# Patient Record
Sex: Female | Born: 1965
Health system: Southern US, Community
[De-identification: ages and names within clinical notes are randomized; demographics above are authoritative.]

## PROBLEM LIST (undated history)

## (undated) DIAGNOSIS — F419 Anxiety disorder, unspecified: Secondary | ICD-10-CM

## (undated) DIAGNOSIS — E119 Type 2 diabetes mellitus without complications: Secondary | ICD-10-CM

## (undated) DIAGNOSIS — T148XXA Other injury of unspecified body region, initial encounter: Secondary | ICD-10-CM

## (undated) DIAGNOSIS — M1990 Unspecified osteoarthritis, unspecified site: Secondary | ICD-10-CM

## (undated) DIAGNOSIS — K219 Gastro-esophageal reflux disease without esophagitis: Secondary | ICD-10-CM

## (undated) DIAGNOSIS — E7800 Pure hypercholesterolemia, unspecified: Secondary | ICD-10-CM

## (undated) DIAGNOSIS — I10 Essential (primary) hypertension: Secondary | ICD-10-CM

## (undated) DIAGNOSIS — T7840XA Allergy, unspecified, initial encounter: Secondary | ICD-10-CM

## (undated) HISTORY — DX: Anxiety disorder, unspecified: F41.9

## (undated) HISTORY — PX: JOINT REPLACEMENT: SHX530

## (undated) HISTORY — DX: Other injury of unspecified body region, initial encounter: T14.8XXA

## (undated) HISTORY — PX: BACK SURGERY: SHX140

## (undated) HISTORY — PX: CARPAL TUNNEL RELEASE: SHX101

---

## 2002-02-01 HISTORY — PX: TUBAL LIGATION: SHX77

## 2005-06-03 HISTORY — PX: ROTATOR CUFF REPAIR: SHX139

## 2011-06-04 HISTORY — PX: OTHER SURGICAL HISTORY: SHX169

## 2013-04-03 HISTORY — PX: KNEE SURGERY: SHX244

## 2019-05-04 HISTORY — PX: ROTATOR CUFF REPAIR: SHX139

## 2020-01-03 LAB — TSH: TSH: 1.24 (ref <=5.90)

## 2020-03-05 ENCOUNTER — Other Ambulatory Visit: Payer: Self-pay

## 2020-03-05 ENCOUNTER — Encounter (HOSPITAL_COMMUNITY): Payer: Self-pay | Admitting: Emergency Medicine

## 2020-03-05 ENCOUNTER — Emergency Department (HOSPITAL_COMMUNITY)
Admission: EM | Admit: 2020-03-05 | Discharge: 2020-03-05 | Disposition: A | Discharge status: Home / Self Care | Payer: Medicare HMO | Attending: Emergency Medicine | Admitting: Emergency Medicine

## 2020-03-05 ENCOUNTER — Emergency Department (HOSPITAL_COMMUNITY): Payer: Medicare HMO

## 2020-03-05 DIAGNOSIS — R0789 Other chest pain: Secondary | ICD-10-CM | POA: Diagnosis not present

## 2020-03-05 DIAGNOSIS — Z79899 Other long term (current) drug therapy: Secondary | ICD-10-CM | POA: Insufficient documentation

## 2020-03-05 DIAGNOSIS — R079 Chest pain, unspecified: Secondary | ICD-10-CM

## 2020-03-05 DIAGNOSIS — Z7982 Long term (current) use of aspirin: Secondary | ICD-10-CM | POA: Diagnosis not present

## 2020-03-05 HISTORY — DX: Essential (primary) hypertension: I10

## 2020-03-05 HISTORY — DX: Pure hypercholesterolemia, unspecified: E78.00

## 2020-03-05 HISTORY — DX: Unspecified osteoarthritis, unspecified site: M19.90

## 2020-03-05 HISTORY — DX: Allergy, unspecified, initial encounter: T78.40XA

## 2020-03-05 HISTORY — DX: Type 2 diabetes mellitus without complications: E11.9

## 2020-03-05 HISTORY — DX: Gastro-esophageal reflux disease without esophagitis: K21.9

## 2020-03-05 LAB — BASIC METABOLIC PANEL
Anion gap: 11 (ref 5–15)
BUN: 11 mg/dL (ref 6–20)
CO2: 23 mmol/L (ref 22–32)
Calcium: 9.4 mg/dL (ref 8.9–10.3)
Chloride: 105 mmol/L (ref 98–111)
Creatinine, Ser: 0.62 mg/dL (ref 0.44–1.00)
GFR calc Af Amer: >60 mL/min (ref >60)
GFR calc non Af Amer: >60 mL/min (ref >60)
Glucose, Bld: 140 mg/dL — ABNORMAL HIGH (ref 70–99)
Potassium: 4.1 mmol/L (ref 3.5–5.1)
Sodium: 139 mmol/L (ref 135–145)

## 2020-03-05 LAB — CBC
HCT: 36 % (ref 36.0–46.0)
Hemoglobin: 11.8 g/dL — ABNORMAL LOW (ref 12.0–15.0)
MCH: 29.4 pg (ref 26.0–34.0)
MCHC: 32.8 g/dL (ref 30.0–36.0)
MCV: 89.6 fL (ref 80.0–100.0)
Platelets: 336 10*3/uL (ref 150–400)
RBC: 4.02 MIL/uL (ref 3.87–5.11)
RDW: 13.6 % (ref 11.5–15.5)
WBC: 5.3 10*3/uL (ref 4.0–10.5)
nRBC: 0 % (ref 0.0–0.2)

## 2020-03-05 LAB — I-STAT BETA HCG BLOOD, ED (MC, WL, AP ONLY): I-stat hCG, quantitative: <5 m[IU]/mL (ref <5)

## 2020-03-05 LAB — TROPONIN I (HIGH SENSITIVITY)
Troponin I (High Sensitivity): 4 ng/L (ref <18)
Troponin I (High Sensitivity): 3 ng/L (ref <18)

## 2020-03-05 MED ORDER — SODIUM CHLORIDE 0.9% FLUSH: 3.0000 mL | Freq: Once | INTRAVENOUS | Status: DC

## 2020-03-05 NOTE — ED Provider Notes (Signed)
[facility] EMERGENCY DEPARTMENT Provider Note   CSN: [id number] Arrival date & time: 03/05/20  0750     History No chief complaint on file.   Allure Ia Leeb is a 54 y.o. female.  HPI  HPI: A 54 year old patient with a history of treated diabetes, hypertension, hypercholesterolemia and obesity presents for evaluation of chest pain. Initial onset of pain was more than 6 hours ago. The patient's chest pain is described as heaviness/pressure/tightness and is not worse with exertion. The patient complains of nausea. The patient's chest pain is middle- or left-sided, is not well-localized, is not sharp and does radiate to the arms/jaw/neck. The patient denies diaphoresis. The patient has a family history of coronary artery disease in a first-degree relative with onset less than age 81. The patient has no history of stroke, has no history of peripheral artery disease and has not smoked in the past 90 days.  Pain was moderate.  It has resolved prior to my evaluation without intervention.  Pain began after waking up in the early morning hours but did not wake from sleep.  Patient states followed at [facility] [address]  History reviewed. No pertinent past medical history.  There are no problems to display for this patient.   History reviewed. No pertinent surgical history.   OB History   No obstetric history on file.     History reviewed. No pertinent family history.  Social History   Tobacco Use  . Smoking status: Never Smoker  . Smokeless tobacco: Never Used  Substance Use Topics  . Alcohol use: Not on file  . Drug use: Not on file    Home Medications Prior to Admission medications   Medication Sig Start Date End Date Taking? Authorizing Provider  amLODipine (NORVASC) 5 MG tablet Take 5 mg by mouth daily. 01/12/20  Yes [provider]  ascorbic acid (VITAMIN C) 500 MG tablet Take 500 mg by mouth daily.   Yes [provider]    Aspirin Buf,CaCarb-MgCarb-MgO, 81 MG TABS Take 81 mg by mouth daily.   Yes [provider]  Carboxymethylcellulose Sodium (REFRESH LIQUIGEL OP) Apply 1 application to eye daily.   Yes [provider]  Cholecalciferol 125 MCG (5000 UT) TABS Take 125 mcg by mouth daily.   Yes [provider]  fluticasone (FLONASE) 50 MCG/ACT nasal spray Place 1 spray into both nostrils daily. 01/12/20  Yes [provider]  furosemide (LASIX) 20 MG tablet Take 20 mg by mouth daily. 02/06/20  Yes [provider]  gabapentin (NEURONTIN) 300 MG capsule Take 300 mg by mouth in the morning, at noon, and at bedtime. 09/21/19  Yes [provider]  glipiZIDE (GLUCOTROL XL) 5 MG 24 hr tablet Take 10 mg by mouth daily.    Yes [provider]  HYDROcodone-acetaminophen (NORCO/VICODIN) 5-325 MG tablet Take 1 tablet by mouth 3 (three) times daily as needed for pain. 02/24/20  Yes [provider]  IBU 800 MG tablet Take 800 mg by mouth every 8 (eight) hours. 01/18/20  Yes [provider]  metFORMIN (GLUCOPHAGE-XR) 500 MG 24 hr tablet Take 1,000 mg by mouth in the morning and at bedtime.  12/13/14  Yes [provider]  montelukast (SINGULAIR) 10 MG tablet Take 10 mg by mouth daily. 01/16/20  Yes [provider]  Olopatadine HCl 0.2 % SOLN Place 1 drop into both eyes daily.   Yes [provider]  omeprazole (PRILOSEC) 20 MG capsule Take 20 mg  by mouth daily. 02/15/20  Yes [provider]  potassium chloride (KLOR-CON) 10 MEQ tablet Take 10 mEq by mouth daily. 01/21/20  Yes [provider]  pravastatin (PRAVACHOL) 40 MG tablet Take 40 mg by mouth daily. 01/16/20  Yes [provider]  pregabalin (LYRICA) 25 MG capsule Take 25 mg by mouth at bedtime. 02/24/20  Yes [provider]  tiZANidine (ZANAFLEX) 4 MG tablet Take 4 mg by mouth 3 (three) times daily. 12/20/19  Yes [provider]  vitamin  B-12 (CYANOCOBALAMIN) 1000 MCG tablet Take 1,000 mcg by mouth daily.   Yes [provider]    Allergies    Morphine and related  Review of Systems   Review of Systems  All other systems reviewed and are negative.   Physical Exam Updated Vital Signs BP 114/69 (BP Location: Right Arm)   Pulse 88   Temp 97.9 F (36.6 C) (Oral)   Resp 16   Ht 1.6 m (5\' 3" )   Wt 108.9 kg   SpO2 98%   BMI 42.51 kg/m   Physical Exam Vitals and nursing note reviewed.  Constitutional:      Appearance: She is well-developed. She is obese.  HENT:     Head: Normocephalic and atraumatic.     Right Ear: External ear normal.     Left Ear: External ear normal.     Nose: Nose normal.  Eyes:     Conjunctiva/sclera: Conjunctivae normal.     Pupils: Pupils are equal, round, and reactive to light.  Cardiovascular:     Rate and Rhythm: Normal rate and regular rhythm.     Heart sounds: Normal heart sounds.  Pulmonary:     Effort: Pulmonary effort is normal.     Breath sounds: Normal breath sounds.  Abdominal:     General: Bowel sounds are normal.     Palpations: Abdomen is soft.  Musculoskeletal:        General: Normal range of motion.     Cervical back: Normal range of motion and neck supple.  Skin:    General: Skin is warm and dry.  Neurological:     Mental Status: She is alert and oriented to person, place, and time.     Deep Tendon Reflexes: Reflexes are normal and symmetric.  Psychiatric:        Behavior: Behavior normal.        Thought Content: Thought content normal.        Judgment: Judgment normal.     ED Results / Procedures / Treatments   Labs (all labs ordered are listed, but only abnormal results are displayed) Labs Reviewed  BASIC METABOLIC PANEL - Abnormal; Notable for the following components:      Result Value   Glucose, Bld 140 (*)    All other components within normal limits  CBC - Abnormal; Notable for the following components:   Hemoglobin 11.8 (*)    All  other components within normal limits  I-STAT BETA HCG BLOOD, ED ([facility], [facility], [facility] ONLY)  TROPONIN I (HIGH SENSITIVITY)  TROPONIN I (HIGH SENSITIVITY)    EKG EKG Interpretation  Date/Time:  Monday Mar 05 2020 07:55:51 EDT Ventricular Rate:  95 PR Interval:  210 QRS Duration: 140 QT Interval:  382 QTC Calculation: 480 R Axis:   43 Text Interpretation: Sinus rhythm with 1st degree A-V block Right bundle branch block Abnormal ECG Confirmed by Madalyn Rob [phone]) on 03/05/2020 8:06:26 AM   Radiology DG Chest 2 View  Result Date: 03/05/2020 CLINICAL DATA:  Acute chest pain, BILATERAL arm numbness and hand tingling, shortness of breath, nausea, history hypertension and diabetes mellitus EXAM: CHEST - 2 VIEW COMPARISON:  None FINDINGS: Normal heart size, mediastinal contours, and pulmonary vascularity. Lungs clear. No pulmonary infiltrate, pleural effusion or pneumothorax. Scattered endplate spur formation thoracic spine. IMPRESSION: No acute abnormalities. Electronically Signed   By: Ulyses Southward M.D.   On: 03/05/2020 08:31    Procedures Procedures (including critical care time)  Medications Ordered in ED Medications  sodium chloride flush (NS) 0.9 % injection 3 mL (has no administration in time range)    ED Course  I have reviewed the triage vital signs and the nursing notes.  Pertinent labs & imaging results that were available during my care of the patient were reviewed by me and considered in my medical decision making (see chart for details).  54 year old female multiple risk factors for coronary artery disease, heart score 6, presents today complaining of chest pain.  Patient describes the pain as occurring after in the early morning hours.  It is in the center of the chest.  There is some radiation to her arms.  Pain resolved prior to my evaluation without intervention.  She has got some previous episodes which include tingling all over her body.  She was seen in outlying hospital  at that time.  EKG here is normal as well as troponin and repeat troponin are normal.   Feels improved and appears stable for discharge.  We have discussed return precautions and need for follow-up and she voices understanding.   MDM Rules/Calculators/A&P HEAR Score: 6                    Differential diagnosis includes chest wall, musculoskeletal etiologies, pulmonary etiologies including infection, pneumothorax, pulmonary embolism, diseases of the great vessels, diseases of the GI tract including esophagus and gallbladder.  Given patient's history and type of symptoms, cardiac ischemia is most concerning.  Patient evaluation, history, and physical exam as well as studies obtained here in the emergency department including chest x-Christa Fasig, labs have made the initial differential diagnosis less likely.  Patient was evaluated here through heart pathway with EKG, labs, troponin and repeat troponin these remain normal.  Patient is pain-free at this time.  We have discussed that she should return to the emergency department she has any return of her symptoms and she is referred outpatient to cardiology.  Patient voices understanding of plan. Final Clinical Impression(s) / ED Diagnoses Final diagnoses:  Chest pain, unspecified type    Rx / DC Orders ED Discharge Orders    None       Margarita Grizzle, MD 03/05/20 1339

## 2020-03-05 NOTE — ED Triage Notes (Signed)
Pt here from home with c/o cheat tightness that started  around 4 am , pt also c/o bil arm tingling , family cardiac hx ,

## 2020-05-08 ENCOUNTER — Encounter (HOSPITAL_COMMUNITY): Payer: Self-pay

## 2020-05-08 ENCOUNTER — Emergency Department (HOSPITAL_COMMUNITY): Payer: Medicare HMO

## 2020-05-08 ENCOUNTER — Emergency Department (HOSPITAL_COMMUNITY)
Admission: EM | Admit: 2020-05-08 | Discharge: 2020-05-08 | Disposition: A | Discharge status: Home / Self Care | Payer: Medicare HMO | Attending: Emergency Medicine | Admitting: Emergency Medicine

## 2020-05-08 DIAGNOSIS — M542 Cervicalgia: Secondary | ICD-10-CM | POA: Diagnosis not present

## 2020-05-08 DIAGNOSIS — E119 Type 2 diabetes mellitus without complications: Secondary | ICD-10-CM | POA: Insufficient documentation

## 2020-05-08 DIAGNOSIS — Z96659 Presence of unspecified artificial knee joint: Secondary | ICD-10-CM | POA: Insufficient documentation

## 2020-05-08 DIAGNOSIS — Z7982 Long term (current) use of aspirin: Secondary | ICD-10-CM | POA: Insufficient documentation

## 2020-05-08 DIAGNOSIS — M545 Low back pain, unspecified: Secondary | ICD-10-CM

## 2020-05-08 DIAGNOSIS — Z7984 Long term (current) use of oral hypoglycemic drugs: Secondary | ICD-10-CM | POA: Diagnosis not present

## 2020-05-08 DIAGNOSIS — Z79899 Other long term (current) drug therapy: Secondary | ICD-10-CM | POA: Diagnosis not present

## 2020-05-08 DIAGNOSIS — I10 Essential (primary) hypertension: Secondary | ICD-10-CM | POA: Diagnosis not present

## 2020-05-08 MED ORDER — LIDOCAINE 5 % EX PTCH: 2.0000 | MEDICATED_PATCH | CUTANEOUS | Status: DC

## 2020-05-08 NOTE — ED Notes (Signed)
An After Visit Summary was printed and given to the patient. Discharge instructions given and no further questions at this time.  
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
## 2020-05-18 ENCOUNTER — Other Ambulatory Visit: Payer: Self-pay | Admitting: General Practice

## 2020-05-29 ENCOUNTER — Other Ambulatory Visit: Payer: Self-pay | Admitting: General Practice

## 2020-06-12 ENCOUNTER — Encounter: Payer: Self-pay | Admitting: Endocrinology

## 2020-06-12 LAB — T4, FREE
PTH: 24
T4,Free (Direct): 1.1

## 2020-06-14 ENCOUNTER — Other Ambulatory Visit: Payer: Self-pay | Admitting: General Practice

## 2020-06-14 DIAGNOSIS — G8929 Other chronic pain: Secondary | ICD-10-CM

## 2020-06-14 DIAGNOSIS — R519 Headache, unspecified: Secondary | ICD-10-CM

## 2020-06-28 ENCOUNTER — Other Ambulatory Visit: Payer: Self-pay

## 2020-06-28 ENCOUNTER — Ambulatory Visit
Admission: RE | Admit: 2020-06-28 | Discharge: 2020-06-28 | Disposition: A | Discharge status: Home / Self Care | Payer: Medicare HMO | Source: Ambulatory Visit | Attending: General Practice | Admitting: General Practice

## 2020-06-28 DIAGNOSIS — R519 Headache, unspecified: Secondary | ICD-10-CM

## 2020-06-28 DIAGNOSIS — G8929 Other chronic pain: Secondary | ICD-10-CM

## 2020-06-28 MED ORDER — IOPAMIDOL (ISOVUE-300) INJECTION 61%
75.0000 mL | Freq: Once | INTRAVENOUS | Status: AC | PRN
  Administered 2020-06-28: 75 mL via INTRAVENOUS

## 2020-07-04 DIAGNOSIS — G894 Chronic pain syndrome: Secondary | ICD-10-CM | POA: Diagnosis not present

## 2020-07-04 DIAGNOSIS — M5030 Other cervical disc degeneration, unspecified cervical region: Secondary | ICD-10-CM | POA: Diagnosis not present

## 2020-07-04 DIAGNOSIS — M5136 Other intervertebral disc degeneration, lumbar region: Secondary | ICD-10-CM | POA: Diagnosis not present

## 2020-07-04 DIAGNOSIS — M545 Low back pain: Secondary | ICD-10-CM | POA: Diagnosis not present

## 2020-07-04 DIAGNOSIS — M47812 Spondylosis without myelopathy or radiculopathy, cervical region: Secondary | ICD-10-CM | POA: Diagnosis not present

## 2020-07-04 DIAGNOSIS — G629 Polyneuropathy, unspecified: Secondary | ICD-10-CM | POA: Diagnosis not present

## 2020-07-12 ENCOUNTER — Encounter: Payer: Self-pay | Admitting: Endocrinology

## 2020-07-12 ENCOUNTER — Other Ambulatory Visit: Payer: Self-pay

## 2020-07-12 ENCOUNTER — Ambulatory Visit (INDEPENDENT_AMBULATORY_CARE_PROVIDER_SITE_OTHER): Payer: Medicare HMO | Admitting: Endocrinology

## 2020-07-12 DIAGNOSIS — E041 Nontoxic single thyroid nodule: Secondary | ICD-10-CM | POA: Diagnosis not present
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
## 2020-07-12 NOTE — Patient Instructions (Signed)
Let's check the ultrasound.  you will receive a phone call, about a day and time for an appointment. If a biopsy is advised, I'll request it for you. If a repeat ultrasound is advised in 1 year, you could come back here, or see your doctor in [place].

## 2020-07-13 DIAGNOSIS — Z83511 Family history of glaucoma: Secondary | ICD-10-CM | POA: Diagnosis not present

## 2020-07-13 DIAGNOSIS — H52223 Regular astigmatism, bilateral: Secondary | ICD-10-CM | POA: Diagnosis not present

## 2020-07-13 DIAGNOSIS — H40003 Preglaucoma, unspecified, bilateral: Secondary | ICD-10-CM | POA: Diagnosis not present

## 2020-07-13 DIAGNOSIS — H16229 Keratoconjunctivitis sicca, not specified as Sjogren's, unspecified eye: Secondary | ICD-10-CM | POA: Diagnosis not present

## 2020-07-13 DIAGNOSIS — H40013 Open angle with borderline findings, low risk, bilateral: Secondary | ICD-10-CM | POA: Diagnosis not present

## 2020-07-13 DIAGNOSIS — H33301 Unspecified retinal break, right eye: Secondary | ICD-10-CM | POA: Diagnosis not present

## 2020-07-13 DIAGNOSIS — E119 Type 2 diabetes mellitus without complications: Secondary | ICD-10-CM | POA: Diagnosis not present

## 2020-07-13 DIAGNOSIS — H5213 Myopia, bilateral: Secondary | ICD-10-CM | POA: Diagnosis not present

## 2020-07-13 DIAGNOSIS — H1045 Other chronic allergic conjunctivitis: Secondary | ICD-10-CM | POA: Diagnosis not present

## 2020-07-19 ENCOUNTER — Ambulatory Visit
Admission: RE | Admit: 2020-07-19 | Discharge: 2020-07-19 | Disposition: A | Discharge status: Home / Self Care | Payer: Medicare HMO | Source: Ambulatory Visit | Attending: Endocrinology | Admitting: Endocrinology

## 2020-07-19 DIAGNOSIS — E041 Nontoxic single thyroid nodule: Secondary | ICD-10-CM

## 2020-07-20 ENCOUNTER — Telehealth: Payer: Self-pay

## 2020-07-20 NOTE — Telephone Encounter (Signed)
-----   Message from Romero Belling, MD sent at 07/19/2020  5:27 PM EDT ----- please contact patient: 1 year f/u is advised.  Please come back for a follow-up appointment in 1 year.

## 2020-07-20 NOTE — Telephone Encounter (Signed)
RESULTS  Results were reviewed by Dr. Ellison. A letter has been mailed to pt home address. For future reference, letter can be found in Epic. 

## 2020-07-27 DIAGNOSIS — M545 Low back pain: Secondary | ICD-10-CM | POA: Diagnosis not present

## 2020-07-27 DIAGNOSIS — M5136 Other intervertebral disc degeneration, lumbar region: Secondary | ICD-10-CM | POA: Diagnosis not present

## 2020-07-27 DIAGNOSIS — Z79899 Other long term (current) drug therapy: Secondary | ICD-10-CM | POA: Diagnosis not present

## 2020-07-27 DIAGNOSIS — G894 Chronic pain syndrome: Secondary | ICD-10-CM | POA: Diagnosis not present

## 2020-07-27 DIAGNOSIS — G629 Polyneuropathy, unspecified: Secondary | ICD-10-CM | POA: Diagnosis not present

## 2020-08-03 DIAGNOSIS — M47812 Spondylosis without myelopathy or radiculopathy, cervical region: Secondary | ICD-10-CM | POA: Diagnosis not present

## 2020-08-03 DIAGNOSIS — G894 Chronic pain syndrome: Secondary | ICD-10-CM | POA: Diagnosis not present

## 2020-08-03 DIAGNOSIS — M5030 Other cervical disc degeneration, unspecified cervical region: Secondary | ICD-10-CM | POA: Diagnosis not present

## 2020-08-03 DIAGNOSIS — M5459 Other low back pain: Secondary | ICD-10-CM | POA: Diagnosis not present

## 2020-08-03 DIAGNOSIS — M5136 Other intervertebral disc degeneration, lumbar region: Secondary | ICD-10-CM | POA: Diagnosis not present

## 2020-08-03 DIAGNOSIS — G629 Polyneuropathy, unspecified: Secondary | ICD-10-CM | POA: Diagnosis not present

## 2020-08-22 DIAGNOSIS — H16149 Punctate keratitis, unspecified eye: Secondary | ICD-10-CM | POA: Diagnosis not present

## 2020-08-22 DIAGNOSIS — H16223 Keratoconjunctivitis sicca, not specified as Sjogren's, bilateral: Secondary | ICD-10-CM | POA: Diagnosis not present

## 2020-08-22 DIAGNOSIS — H16229 Keratoconjunctivitis sicca, not specified as Sjogren's, unspecified eye: Secondary | ICD-10-CM | POA: Diagnosis not present

## 2020-08-22 DIAGNOSIS — H04129 Dry eye syndrome of unspecified lacrimal gland: Secondary | ICD-10-CM | POA: Diagnosis not present

## 2020-08-23 ENCOUNTER — Encounter: Payer: Self-pay | Admitting: Neurology

## 2020-08-23 ENCOUNTER — Ambulatory Visit (INDEPENDENT_AMBULATORY_CARE_PROVIDER_SITE_OTHER): Payer: Medicare HMO | Admitting: Neurology

## 2020-08-23 VITALS — BP 135/86 | HR 89 | Wt 247.0 lb

## 2020-08-23 DIAGNOSIS — M7918 Myalgia, other site: Secondary | ICD-10-CM | POA: Diagnosis not present

## 2020-08-23 DIAGNOSIS — M542 Cervicalgia: Secondary | ICD-10-CM | POA: Diagnosis not present

## 2020-08-23 DIAGNOSIS — G4733 Obstructive sleep apnea (adult) (pediatric): Secondary | ICD-10-CM | POA: Diagnosis not present

## 2020-08-23 NOTE — Patient Instructions (Addendum)
Physical at [facility] for cervical myofascial pain syndrome and cervicalgia to include stretching, heating, manual therapy/massage, strengthening and dry needling. [employer].  Chronic neck pain and cervicalgia.  Rrecommend follow up with pain management for possible interventions such as epidural steroid injections, occipital nerve blocks or facet blocks as well as medical management  Recommend PCP consider ENT evaluation for direct inspection of following: CT of the head showed prominent soft tissue in the posterior nasopharynx, partially imaged but most likely representing adenoid hypertrophy (which can predispose to obstructive sleep apnea). Recommend correlation with direct inspection. Discussed with patient, she is to follow up with pcp  to see if ENT referral is warranted.
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
## 2020-08-24 DIAGNOSIS — G629 Polyneuropathy, unspecified: Secondary | ICD-10-CM | POA: Diagnosis not present

## 2020-08-24 DIAGNOSIS — M545 Low back pain, unspecified: Secondary | ICD-10-CM | POA: Diagnosis not present

## 2020-08-24 DIAGNOSIS — Z79899 Other long term (current) drug therapy: Secondary | ICD-10-CM | POA: Diagnosis not present

## 2020-08-24 DIAGNOSIS — G894 Chronic pain syndrome: Secondary | ICD-10-CM | POA: Diagnosis not present

## 2020-08-24 DIAGNOSIS — M5136 Other intervertebral disc degeneration, lumbar region: Secondary | ICD-10-CM | POA: Diagnosis not present

## 2020-08-26 ENCOUNTER — Encounter: Payer: Self-pay | Admitting: Neurology

## 2020-08-26 DIAGNOSIS — M7918 Myalgia, other site: Secondary | ICD-10-CM | POA: Insufficient documentation

## 2020-08-26 DIAGNOSIS — M542 Cervicalgia: Secondary | ICD-10-CM | POA: Insufficient documentation

## 2020-09-03 DIAGNOSIS — M5136 Other intervertebral disc degeneration, lumbar region: Secondary | ICD-10-CM | POA: Diagnosis not present

## 2020-09-03 DIAGNOSIS — M5030 Other cervical disc degeneration, unspecified cervical region: Secondary | ICD-10-CM | POA: Diagnosis not present

## 2020-09-03 DIAGNOSIS — M5459 Other low back pain: Secondary | ICD-10-CM | POA: Diagnosis not present

## 2020-09-03 DIAGNOSIS — G629 Polyneuropathy, unspecified: Secondary | ICD-10-CM | POA: Diagnosis not present

## 2020-09-03 DIAGNOSIS — G894 Chronic pain syndrome: Secondary | ICD-10-CM | POA: Diagnosis not present

## 2020-09-03 DIAGNOSIS — M47812 Spondylosis without myelopathy or radiculopathy, cervical region: Secondary | ICD-10-CM | POA: Diagnosis not present

## 2020-09-20 DIAGNOSIS — E1169 Type 2 diabetes mellitus with other specified complication: Secondary | ICD-10-CM | POA: Diagnosis not present

## 2020-09-20 DIAGNOSIS — I10 Essential (primary) hypertension: Secondary | ICD-10-CM | POA: Diagnosis not present

## 2020-09-20 DIAGNOSIS — K219 Gastro-esophageal reflux disease without esophagitis: Secondary | ICD-10-CM | POA: Diagnosis not present

## 2020-09-20 DIAGNOSIS — E7800 Pure hypercholesterolemia, unspecified: Secondary | ICD-10-CM | POA: Diagnosis not present

## 2020-09-20 DIAGNOSIS — J309 Allergic rhinitis, unspecified: Secondary | ICD-10-CM | POA: Diagnosis not present

## 2020-09-20 DIAGNOSIS — M1990 Unspecified osteoarthritis, unspecified site: Secondary | ICD-10-CM | POA: Diagnosis not present

## 2020-09-20 DIAGNOSIS — J352 Hypertrophy of adenoids: Secondary | ICD-10-CM | POA: Diagnosis not present

## 2020-09-24 DIAGNOSIS — M5136 Other intervertebral disc degeneration, lumbar region: Secondary | ICD-10-CM | POA: Diagnosis not present

## 2020-09-24 DIAGNOSIS — M545 Low back pain, unspecified: Secondary | ICD-10-CM | POA: Diagnosis not present

## 2020-09-24 DIAGNOSIS — Z79899 Other long term (current) drug therapy: Secondary | ICD-10-CM | POA: Diagnosis not present

## 2020-09-24 DIAGNOSIS — G629 Polyneuropathy, unspecified: Secondary | ICD-10-CM | POA: Diagnosis not present

## 2020-09-24 DIAGNOSIS — G894 Chronic pain syndrome: Secondary | ICD-10-CM | POA: Diagnosis not present

## 2020-10-03 DIAGNOSIS — M5030 Other cervical disc degeneration, unspecified cervical region: Secondary | ICD-10-CM | POA: Diagnosis not present

## 2020-10-03 DIAGNOSIS — G629 Polyneuropathy, unspecified: Secondary | ICD-10-CM | POA: Diagnosis not present

## 2020-10-03 DIAGNOSIS — M47812 Spondylosis without myelopathy or radiculopathy, cervical region: Secondary | ICD-10-CM | POA: Diagnosis not present

## 2020-10-03 DIAGNOSIS — G894 Chronic pain syndrome: Secondary | ICD-10-CM | POA: Diagnosis not present

## 2020-10-03 DIAGNOSIS — M5136 Other intervertebral disc degeneration, lumbar region: Secondary | ICD-10-CM | POA: Diagnosis not present

## 2020-10-03 DIAGNOSIS — M5459 Other low back pain: Secondary | ICD-10-CM | POA: Diagnosis not present

## 2020-10-17 DIAGNOSIS — M545 Low back pain, unspecified: Secondary | ICD-10-CM | POA: Diagnosis not present

## 2020-10-17 DIAGNOSIS — G894 Chronic pain syndrome: Secondary | ICD-10-CM | POA: Diagnosis not present

## 2020-10-17 DIAGNOSIS — M5136 Other intervertebral disc degeneration, lumbar region: Secondary | ICD-10-CM | POA: Diagnosis not present

## 2020-10-17 DIAGNOSIS — Z79899 Other long term (current) drug therapy: Secondary | ICD-10-CM | POA: Diagnosis not present

## 2020-10-17 DIAGNOSIS — M47812 Spondylosis without myelopathy or radiculopathy, cervical region: Secondary | ICD-10-CM | POA: Diagnosis not present

## 2020-10-19 DIAGNOSIS — J988 Other specified respiratory disorders: Secondary | ICD-10-CM | POA: Diagnosis not present

## 2020-10-19 DIAGNOSIS — J029 Acute pharyngitis, unspecified: Secondary | ICD-10-CM | POA: Diagnosis not present

## 2020-10-19 DIAGNOSIS — B9789 Other viral agents as the cause of diseases classified elsewhere: Secondary | ICD-10-CM | POA: Diagnosis not present

## 2020-10-30 DIAGNOSIS — M5136 Other intervertebral disc degeneration, lumbar region: Secondary | ICD-10-CM | POA: Diagnosis not present

## 2020-10-30 DIAGNOSIS — M47812 Spondylosis without myelopathy or radiculopathy, cervical region: Secondary | ICD-10-CM | POA: Diagnosis not present

## 2020-10-30 DIAGNOSIS — G629 Polyneuropathy, unspecified: Secondary | ICD-10-CM | POA: Diagnosis not present

## 2020-10-30 DIAGNOSIS — M5459 Other low back pain: Secondary | ICD-10-CM | POA: Diagnosis not present

## 2020-10-30 DIAGNOSIS — M5030 Other cervical disc degeneration, unspecified cervical region: Secondary | ICD-10-CM | POA: Diagnosis not present

## 2020-10-30 DIAGNOSIS — G894 Chronic pain syndrome: Secondary | ICD-10-CM | POA: Diagnosis not present

## 2020-12-12 IMAGING — DX DG CHEST 2V
2 series · 2 of 2 positions shown · non-contrast
Comparison: None

CLINICAL DATA: Acute chest pain, BILATERAL arm numbness and hand
tingling, shortness of breath, nausea, history hypertension and
diabetes mellitus

EXAM:
CHEST - 2 VIEW

[chest pa]
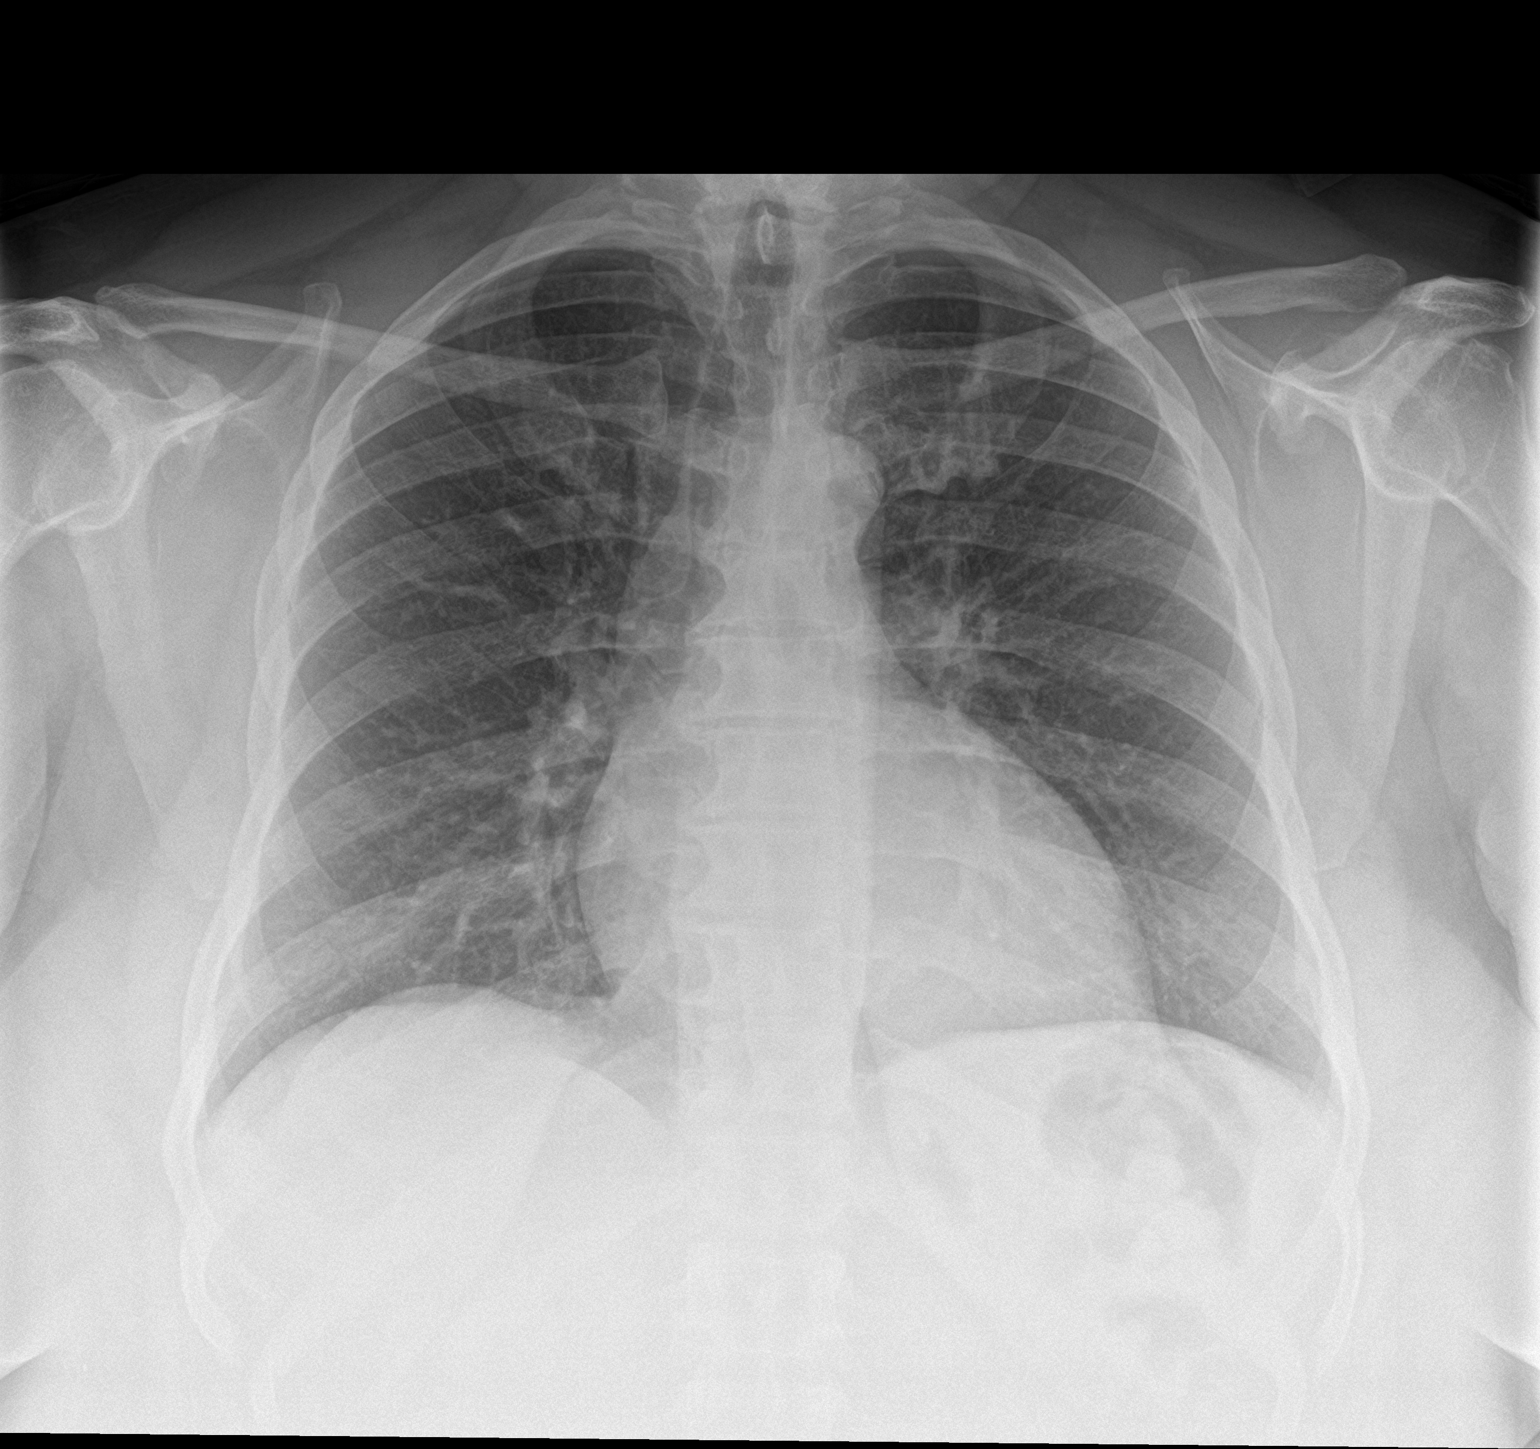

[chest lat]
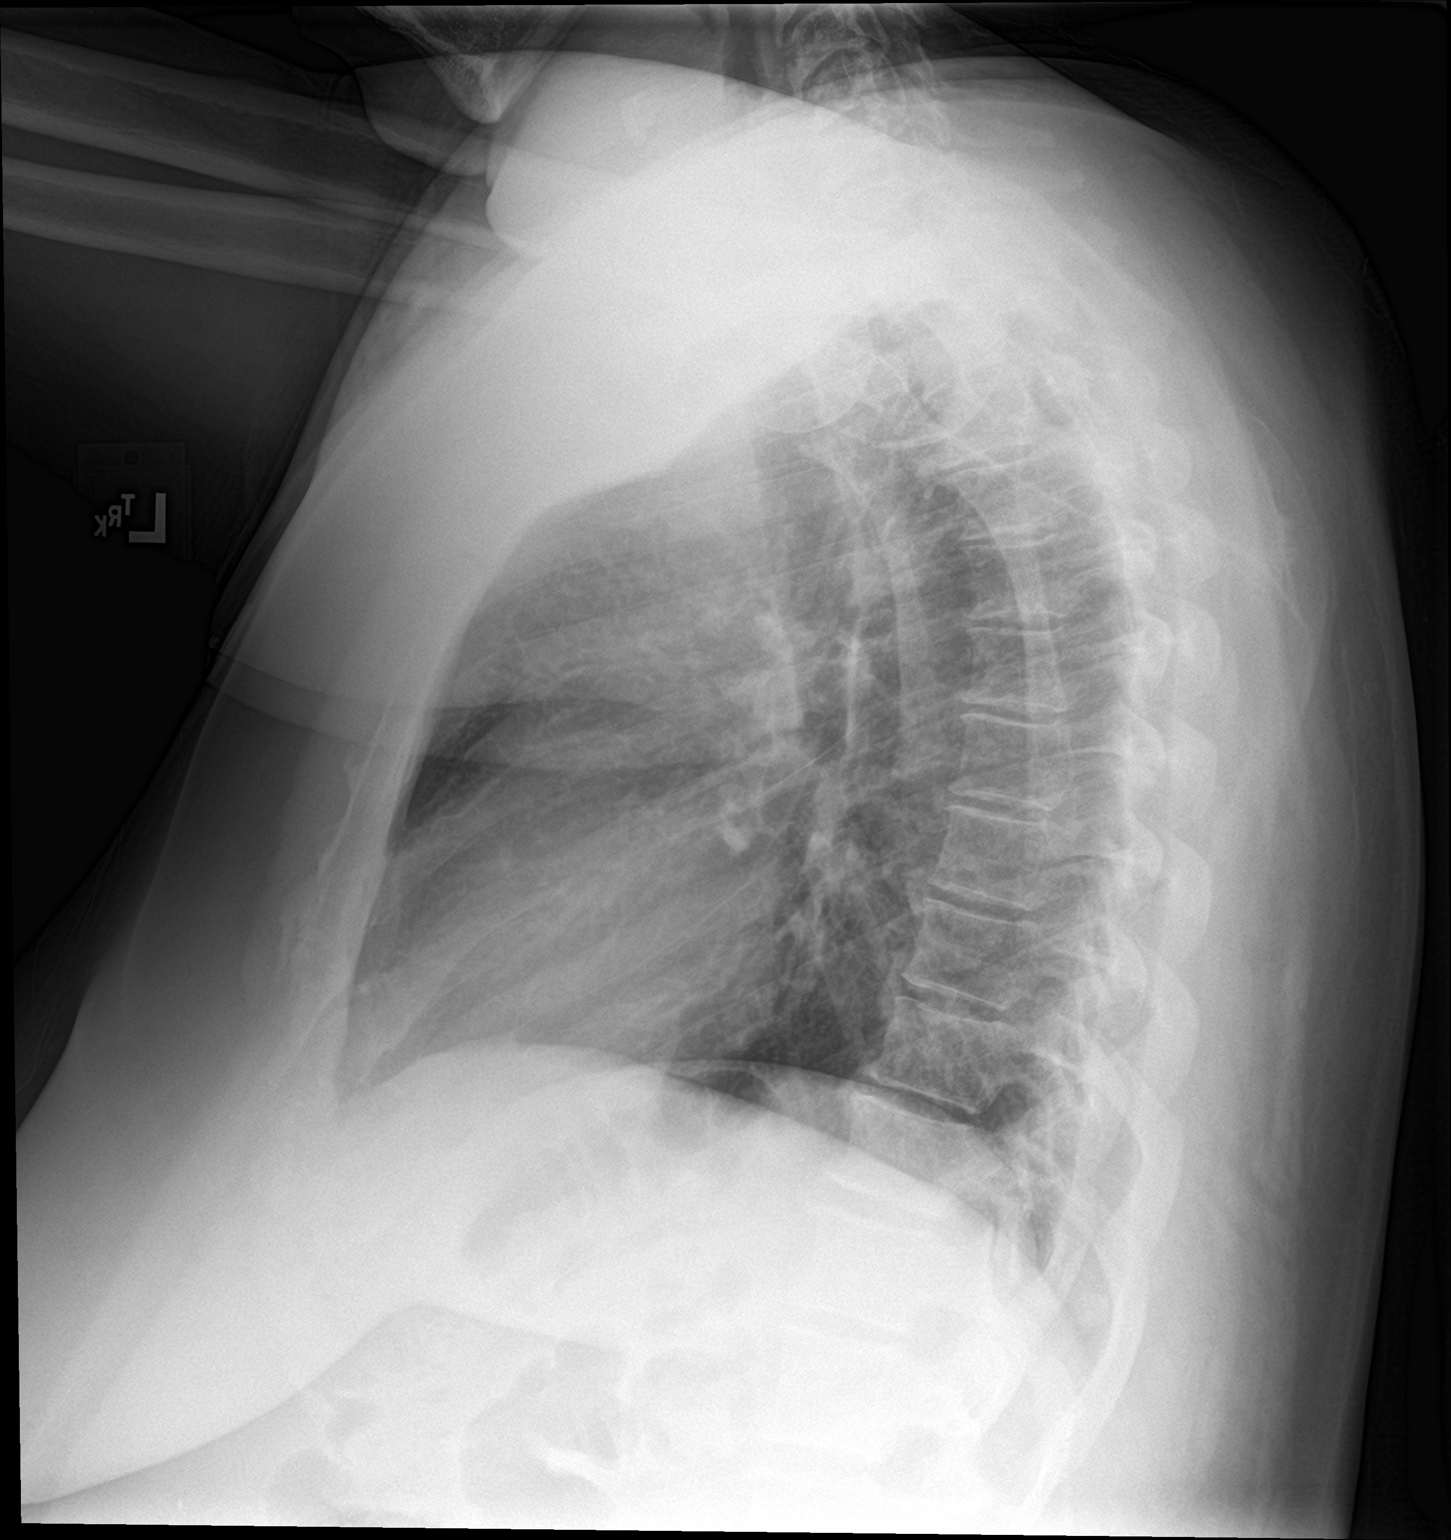

[2 of 2 positions shown; findings below may reference images not displayed]

FINDINGS: Normal heart size, mediastinal contours, and pulmonary vascularity.

Lungs clear.

No pulmonary infiltrate, pleural effusion or pneumothorax.

Scattered endplate spur formation thoracic spine.
IMPRESSION: No acute abnormalities.
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
## 2021-04-27 IMAGING — US US THYROID
1 series · 13 of 25 positions shown · non-contrast
Comparison: None.

CLINICAL DATA: Incidental on CT. Thyroid nodule incidentally noted
on outside CT

EXAM:
THYROID ULTRASOUND
TECHNIQUE: Ultrasound examination of the thyroid gland and adjacent soft
tissues was performed.

[Series 1: us thyroid · 0.08mm/px · 13 of 53 slices shown]
[im 1/53]
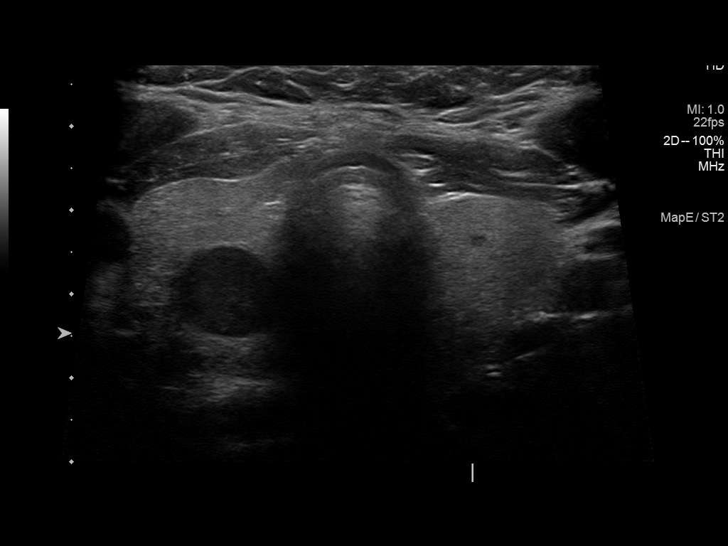
[im 5/53]
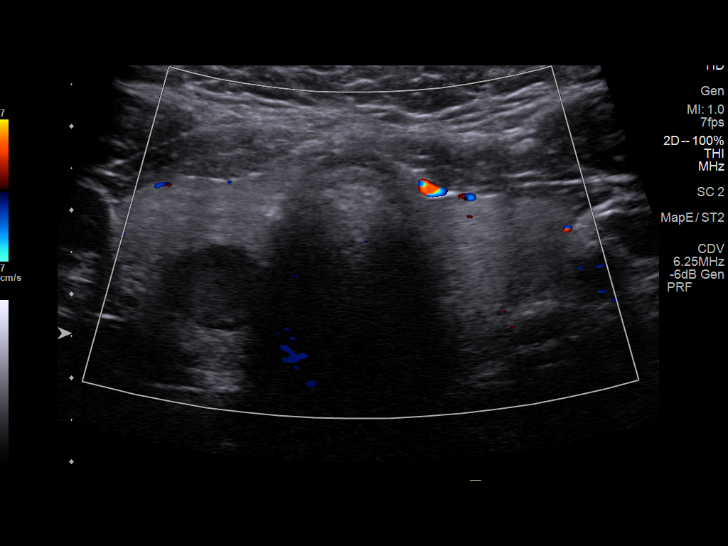
[im 9/53]
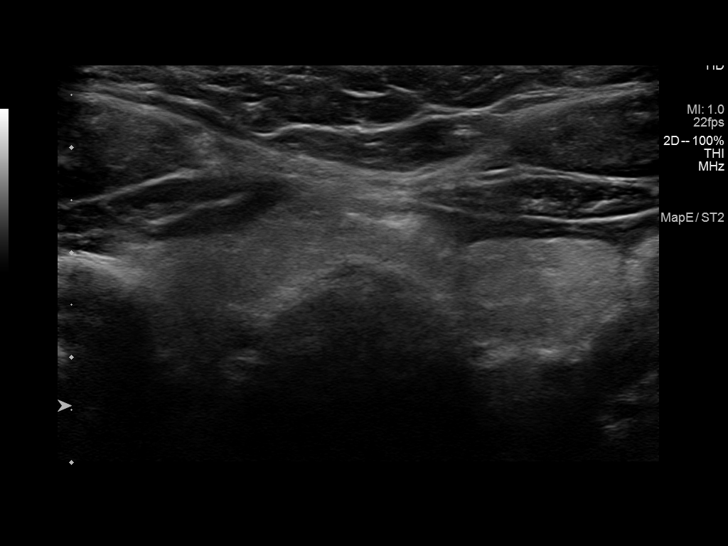
[im 14/53]
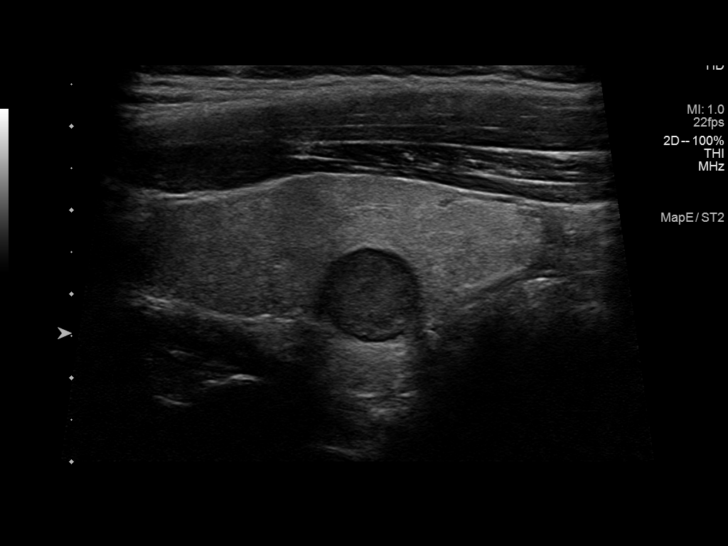
[im 18/53]
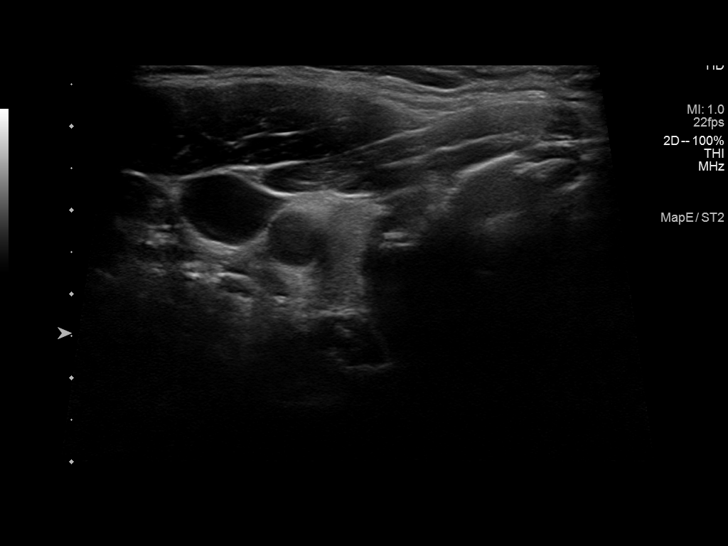
[im 22/53]
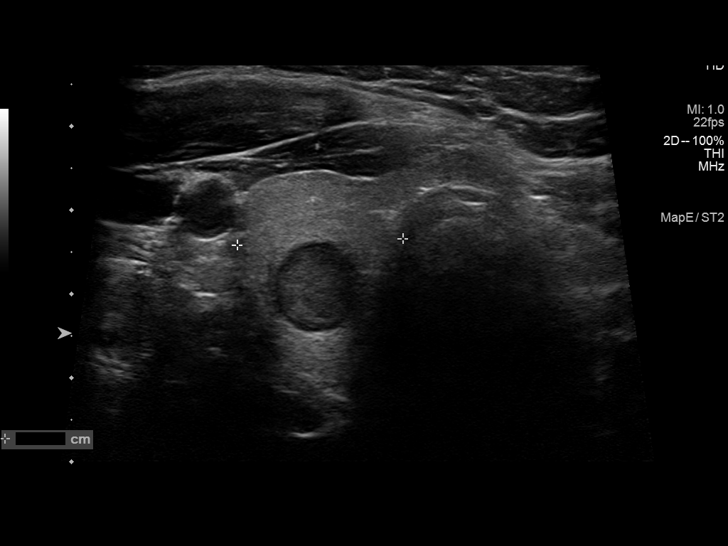
[im 27/53]
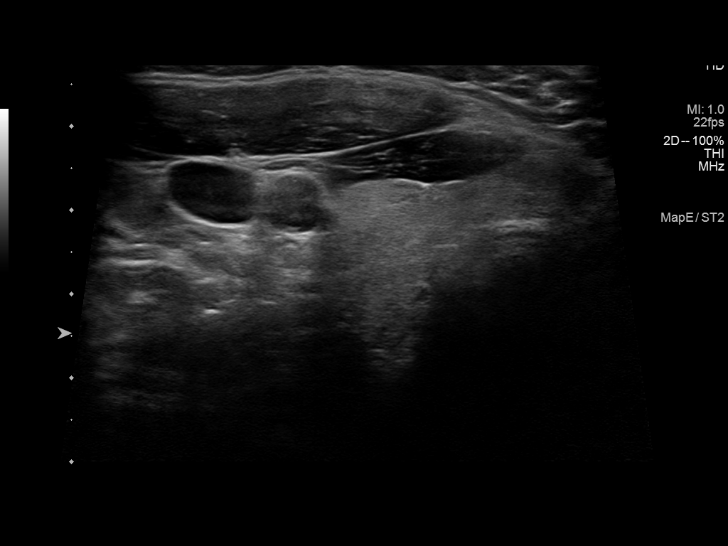
[im 31/53]
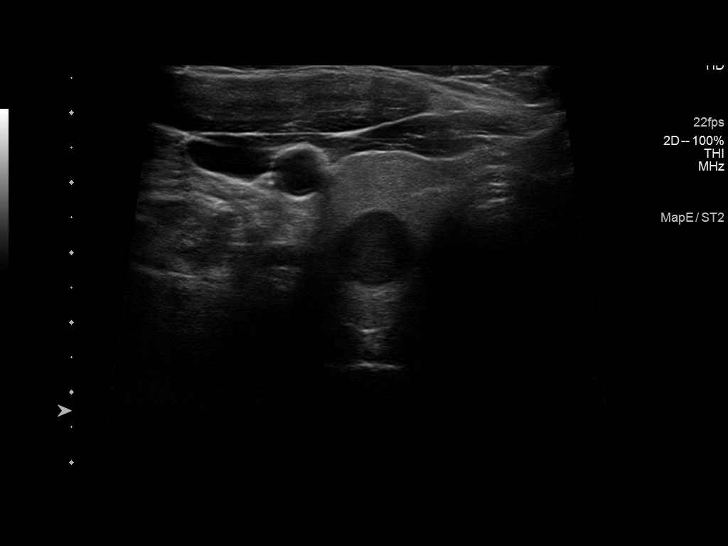
[im 35/53]
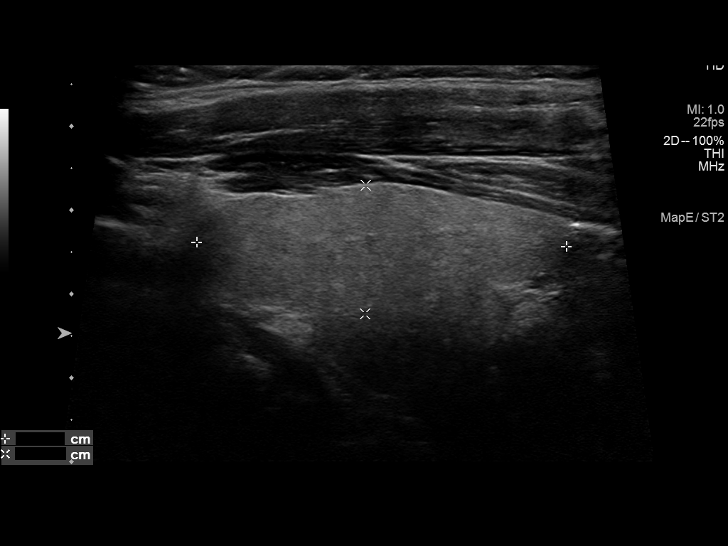
[im 40/53]
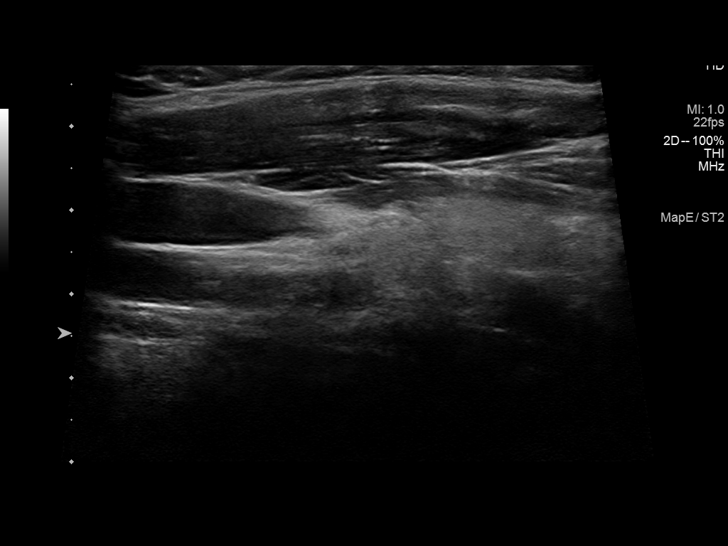
[im 44/53]
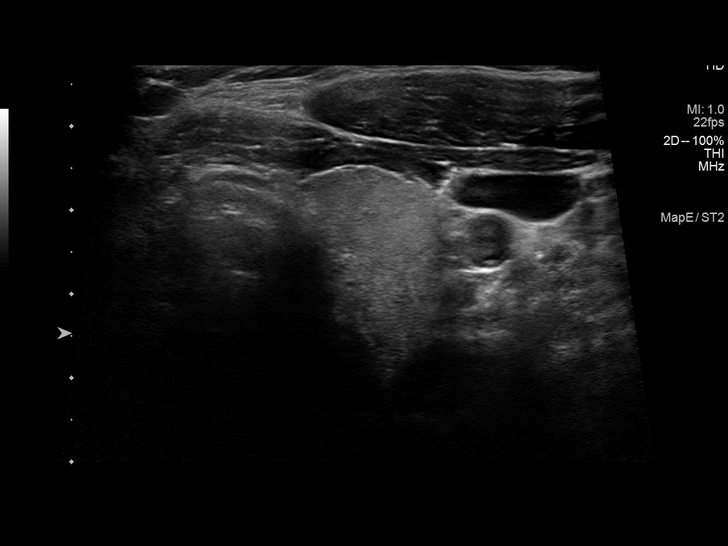
[im 48/53]
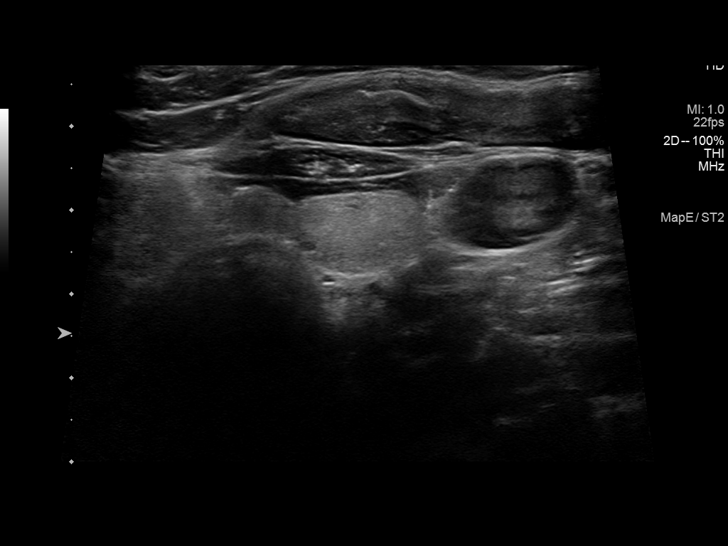
[im 53/53]
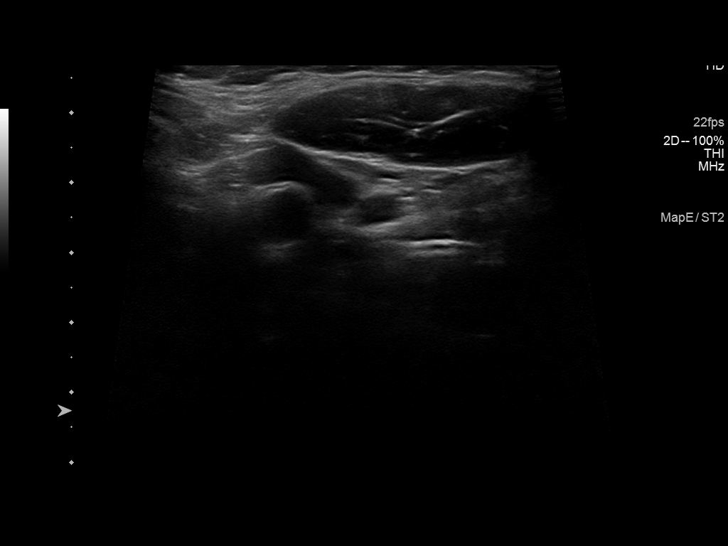

[13 of 25 positions shown; findings below may reference images not displayed]

FINDINGS: Parenchymal Echotexture: Mildly heterogenous

Isthmus: Normal in size measuring 0.5 cm in diameter

Right lobe: Normal in size measuring 5.1 x 2.0 x 2.0 cm

Left lobe: Normal in size measuring 4.41.5 x 1.7 cm

_________________________________________________________

Estimated total number of nodules >/= 1 cm: 1

Number of spongiform nodules >/=  2 cm not described below (TR1): 0

Number of mixed cystic and solid nodules >/= 1.5 cm not described
below (TR2): 0

_________________________________________________________

Nodule # 1:

Location: Right; Mid

Maximum size: 1.3 cm; Other 2 dimensions: 1.1 x 1.0 cm

Composition: solid/almost completely solid (2)

Echogenicity: hypoechoic (2)

Shape: not taller-than-wide (0)

Margins: smooth (0)

Echogenic foci: none (0)

ACR TI-RADS total points: 4.

ACR TI-RADS risk category: TR4 (4-6 points).

ACR TI-RADS recommendations:

*Given size (>/= 1 - 1.4 cm) and appearance, a follow-up ultrasound
in 1 year should be considered based on TI-RADS criteria.

_________________________________________________________
IMPRESSION: Solitary 1.3 cm right-sided thyroid nodule meets imaging criteria to
recommend a 1 year follow-up as indicated.

The above is in keeping with the ACR TI-RADS recommendations - [HOSPITAL] 3437;[DATE].
